# Patient Record
Sex: Male | Born: 1985 | Race: Black or African American | Hispanic: No | Marital: Single | Smoking: Never smoker
Health system: Southern US, Community
[De-identification: ages and names within clinical notes are randomized; demographics above are authoritative.]

## PROBLEM LIST (undated history)

## (undated) DIAGNOSIS — J45909 Unspecified asthma, uncomplicated: Secondary | ICD-10-CM

## (undated) HISTORY — PX: ORTHOPEDIC SURGERY: SHX850

## (undated) HISTORY — PX: WISDOM TOOTH EXTRACTION: SHX21

---

## 2008-10-28 ENCOUNTER — Emergency Department (HOSPITAL_COMMUNITY): Admission: EM | Admit: 2008-10-28 | Discharge: 2008-10-28 | Payer: Self-pay | Admitting: Family Medicine

## 2009-04-17 ENCOUNTER — Emergency Department (HOSPITAL_COMMUNITY): Admission: EM | Admit: 2009-04-17 | Discharge: 2009-04-17 | Payer: Self-pay | Admitting: Family Medicine

## 2010-03-04 ENCOUNTER — Emergency Department (HOSPITAL_COMMUNITY)
Admission: EM | Admit: 2010-03-04 | Discharge: 2010-03-04 | Payer: Self-pay | Source: Home / Self Care | Admitting: Emergency Medicine

## 2012-09-19 ENCOUNTER — Encounter (HOSPITAL_COMMUNITY): Payer: Self-pay | Admitting: *Deleted

## 2012-09-19 ENCOUNTER — Emergency Department (HOSPITAL_COMMUNITY)
Admission: EM | Admit: 2012-09-19 | Discharge: 2012-09-19 | Disposition: A | Discharge status: Home / Self Care | Payer: Worker's Compensation | Attending: Emergency Medicine | Admitting: Emergency Medicine

## 2012-09-19 ENCOUNTER — Emergency Department (HOSPITAL_COMMUNITY): Payer: Worker's Compensation

## 2012-09-19 DIAGNOSIS — S0990XA Unspecified injury of head, initial encounter: Secondary | ICD-10-CM | POA: Insufficient documentation

## 2012-09-19 DIAGNOSIS — W208XXA Other cause of strike by thrown, projected or falling object, initial encounter: Secondary | ICD-10-CM | POA: Insufficient documentation

## 2012-09-19 DIAGNOSIS — Y929 Unspecified place or not applicable: Secondary | ICD-10-CM | POA: Insufficient documentation

## 2012-09-19 DIAGNOSIS — Y9389 Activity, other specified: Secondary | ICD-10-CM | POA: Insufficient documentation

## 2012-09-19 DIAGNOSIS — Z79899 Other long term (current) drug therapy: Secondary | ICD-10-CM | POA: Insufficient documentation

## 2012-09-19 DIAGNOSIS — H538 Other visual disturbances: Secondary | ICD-10-CM | POA: Insufficient documentation

## 2012-09-19 DIAGNOSIS — F0781 Postconcussional syndrome: Secondary | ICD-10-CM | POA: Insufficient documentation

## 2012-09-19 MED ORDER — METOCLOPRAMIDE HCL 5 MG/ML IJ SOLN
10.0000 mg | Freq: Once | INTRAMUSCULAR | Status: AC
  Administered 2012-09-19: 10 mg via INTRAMUSCULAR
  Filled 2012-09-19: qty 2

## 2012-09-19 MED ORDER — ONDANSETRON HCL 4 MG PO TABS: 4.0000 mg | ORAL_TABLET | Freq: Four times a day (QID) | ORAL | Status: AC

## 2012-09-19 MED ORDER — DIPHENHYDRAMINE HCL 50 MG/ML IJ SOLN
50.0000 mg | Freq: Once | INTRAMUSCULAR | Status: AC
  Administered 2012-09-19: 50 mg via INTRAMUSCULAR
  Filled 2012-09-19: qty 1

## 2012-09-19 NOTE — Progress Notes (Signed)
Patient reports that he has a pcp in [place] but can't remember her name.  Also reports that he goes to the [place] but stated, "I can go to any one I want."  Patient stated that he has [employer], male visitor at bedside agreed with this.  [facility] printed patient a list of physicians who accept [employer] insurance within [place] of patient's zip code which is listed as [place].  Patient thankful for information.
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
## 2012-09-20 NOTE — ED Provider Notes (Signed)
Medical screening examination/treatment/procedure(s) were performed by non-physician practitioner and as supervising physician I was immediately available for consultation/collaboration.  Helmuth Recupero, MD 09/20/12 1758 

## 2013-07-16 ENCOUNTER — Encounter (HOSPITAL_COMMUNITY): Payer: Self-pay | Admitting: Emergency Medicine

## 2013-07-16 ENCOUNTER — Emergency Department (HOSPITAL_COMMUNITY)
Admission: EM | Admit: 2013-07-16 | Discharge: 2013-07-16 | Disposition: A | Discharge status: Home / Self Care | Payer: BC Managed Care – HMO | Source: Home / Self Care | Attending: Emergency Medicine | Admitting: Emergency Medicine

## 2013-07-16 DIAGNOSIS — K5289 Other specified noninfective gastroenteritis and colitis: Secondary | ICD-10-CM

## 2013-07-16 DIAGNOSIS — K529 Noninfective gastroenteritis and colitis, unspecified: Secondary | ICD-10-CM

## 2013-07-16 LAB — POCT I-STAT, CHEM 8
BUN: 4 mg/dL — ABNORMAL LOW (ref 6–23)
CREATININE: 1.3 mg/dL (ref 0.50–1.35)
Calcium, Ion: 1.23 mmol/L (ref 1.12–1.23)
Chloride: 97 mEq/L (ref 96–112)
Glucose, Bld: 95 mg/dL (ref 70–99)
HEMATOCRIT: 54 % — AB (ref 39.0–52.0)
HEMOGLOBIN: 18.4 g/dL — AB (ref 13.0–17.0)
POTASSIUM: 4.6 meq/L (ref 3.7–5.3)
SODIUM: 138 meq/L (ref 137–147)
TCO2: 27 mmol/L (ref 0–100)

## 2013-07-16 MED ORDER — ACETAMINOPHEN 325 MG PO TABS
ORAL_TABLET | ORAL | Status: AC
  Filled 2013-07-16: qty 2

## 2013-07-16 MED ORDER — ACETAMINOPHEN 325 MG PO TABS
650.0000 mg | ORAL_TABLET | Freq: Once | ORAL | Status: AC
  Administered 2013-07-16: 650 mg via ORAL

## 2013-07-16 MED ORDER — ONDANSETRON 4 MG PO TBDP
8.0000 mg | ORAL_TABLET | Freq: Once | ORAL | Status: AC
  Administered 2013-07-16: 8 mg via ORAL

## 2013-07-16 MED ORDER — ONDANSETRON 8 MG PO TBDP: 8.0000 mg | ORAL_TABLET | Freq: Three times a day (TID) | ORAL | Status: AC | PRN

## 2013-07-16 MED ORDER — SODIUM CHLORIDE 0.9 % IV SOLN
INTRAVENOUS | Status: DC
  Administered 2013-07-16: 12:00:00 via INTRAVENOUS

## 2013-07-16 MED ORDER — SODIUM CHLORIDE 0.9 % IV SOLN
INTRAVENOUS | Status: DC
  Administered 2013-07-16: 11:00:00 via INTRAVENOUS

## 2013-07-16 MED ORDER — ONDANSETRON 4 MG PO TBDP
ORAL_TABLET | ORAL | Status: AC
  Filled 2013-07-16: qty 2

## 2013-07-16 MED ORDER — DIPHENOXYLATE-ATROPINE 2.5-0.025 MG PO TABS: 1.0000 | ORAL_TABLET | Freq: Four times a day (QID) | ORAL | Status: AC | PRN

## 2013-07-16 NOTE — ED Provider Notes (Signed)
Chief Complaint   Chief Complaint  Patient presents with  . Diarrhea     History of Present Illness   Keiston Logan Boresvans is a 28 year old male who has had a three-day history of diarrhea and nausea after eating at [employer]. The patient has up to 15 loose stools per day. He states in the stools have been red in color. He has lost about 6 pounds. He feels cold and chilled but has had no fever. He felt nauseated but not having vomiting. He notes borborygmus but no bowel pain or cramping. He's had no URI symptoms. No other suspicious ingestions, sick exposures, or foreign travel. He has never had anything like this before.  Review of Systems   Other than as noted above, the patient denies any of the following symptoms: Systemic:  No fevers, chills, or dizziness. ENT:  No nasal congestion, rhinorrhea, or sore throat. Lungs:  No cough. GI:  Blood in stool or vomitus. GU:  No dysuria, frequency, or urgency.  PMFSH   Past medical history, family history, social history, meds, and allergies were reviewed.    Physical Exam     Vital signs:  BP 122/81  Pulse 98  Temp(Src) 99.8 F (37.7 C) (Oral)  Resp 14  SpO2 100% Filed Vitals:   07/16/13 1026 07/16/13 1112 Supine  07/16/13 1112 Sitting  07/16/13 1113 Standing   BP: 122/86 125/74 120/79 122/81  Pulse: 98 79 93 98  Temp: 99.8 F (37.7 C)     TempSrc: Oral     Resp: 14     SpO2: 100%      General:  Alert and oriented.  In no distress.  Skin warm and dry.  Good skin turgor, brisk capillary refill. ENT:  No scleral icterus, moist mucous membranes, no oral lesions, pharynx clear. Lungs:  Breath sounds clear and equal bilaterally.  No wheezes, rales, or rhonchi. Heart:  Rhythm regular, without extrasystoles.  No gallops or murmers. Abdomen:  Soft, nontender, no organomegaly or mass, bowel sounds hyperactive. Skin: Clear, warm, and dry.  Good turgor.  Brisk capillary refill.  Labs   Results for orders placed during the hospital  encounter of 07/16/13  POCT I-STAT, CHEM 8      Result Value Ref Range   Sodium 138  137 - 147 mEq/L   Potassium 4.6  3.7 - 5.3 mEq/L   Chloride 97  96 - 112 mEq/L   BUN 4 (*) 6 - 23 mg/dL   Creatinine, Ser 8.291.30  0.50 - 1.35 mg/dL   Glucose, Bld 95  70 - 99 mg/dL   Calcium, Ion 5.621.23  1.301.12 - 1.23 mmol/L   TCO2 27  0 - 100 mmol/L   Hemoglobin 18.4 (*) 13.0 - 17.0 g/dL   HCT 86.554.0 (*) 78.439.0 - 69.652.0 %     Course in Urgent [facility] Center   Given Zofran ODT 8 mg sublingually and 2 L of normal saline intravenously.Thereafter, he felt a lot better.   Assessment   The encounter diagnosis was Gastroenteritis.  Plan   1.  Meds:  The following meds were prescribed:   New Prescriptions   DIPHENOXYLATE-ATROPINE (LOMOTIL) 2.5-0.025 MG PER TABLET    Take 1 tablet by mouth 4 (four) times daily as needed for diarrhea or loose stools.   ONDANSETRON (ZOFRAN ODT) 8 MG DISINTEGRATING TABLET    Take 1 tablet (8 mg total) by mouth every 8 (eight) hours as needed for nausea.    2.  Patient Education/Counseling:  The patient  was given appropriate handouts, self care instructions, and instructed in symptomatic relief. The patient was told to stay on clear liquids for the remainder of the day, then advance to a B.R.A.T. diet starting tomorrow.   3.  Follow up:  The patient was told to follow up here if no better in 2 to 3 days, or sooner if becoming worse in any way, and given some red flag symptoms such as persistent vomitng, high fever, severe abdominal pain, or any GI bleeding which would prompt immediate return.         Reuben Likesavid C Justus Duerr, MD 07/16/13 [phone]

## 2013-07-16 NOTE — Discharge Instructions (Signed)

## 2013-07-16 NOTE — ED Notes (Signed)
Reports having diarrhea since Saturday.  Unable to eat or drink with having diarrhea.  Has not eaten since Saturday.  6 lb weight loss.  Fatigue.  Headache.  No otc meds taken.
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
## 2020-04-14 ENCOUNTER — Emergency Department (HOSPITAL_BASED_OUTPATIENT_CLINIC_OR_DEPARTMENT_OTHER)

## 2020-04-14 ENCOUNTER — Other Ambulatory Visit: Payer: Self-pay

## 2020-04-14 ENCOUNTER — Emergency Department (HOSPITAL_BASED_OUTPATIENT_CLINIC_OR_DEPARTMENT_OTHER)
Admission: EM | Admit: 2020-04-14 | Discharge: 2020-04-14 | Disposition: A | Discharge status: Home / Self Care | Attending: Emergency Medicine | Admitting: Emergency Medicine

## 2020-04-14 ENCOUNTER — Encounter (HOSPITAL_BASED_OUTPATIENT_CLINIC_OR_DEPARTMENT_OTHER): Payer: Self-pay | Admitting: Emergency Medicine

## 2020-04-14 ENCOUNTER — Other Ambulatory Visit (HOSPITAL_BASED_OUTPATIENT_CLINIC_OR_DEPARTMENT_OTHER): Payer: Self-pay | Admitting: Emergency Medicine

## 2020-04-14 DIAGNOSIS — N50812 Left testicular pain: Secondary | ICD-10-CM

## 2020-04-14 DIAGNOSIS — N5089 Other specified disorders of the male genital organs: Secondary | ICD-10-CM | POA: Insufficient documentation

## 2020-04-14 DIAGNOSIS — I861 Scrotal varices: Secondary | ICD-10-CM | POA: Insufficient documentation

## 2020-04-14 DIAGNOSIS — J45909 Unspecified asthma, uncomplicated: Secondary | ICD-10-CM | POA: Diagnosis not present

## 2020-04-14 HISTORY — DX: Unspecified asthma, uncomplicated: J45.909

## 2020-04-14 LAB — URINALYSIS, ROUTINE W REFLEX MICROSCOPIC
Bilirubin Urine: NEGATIVE
Glucose, UA: NEGATIVE mg/dL
Hgb urine dipstick: NEGATIVE
Ketones, ur: NEGATIVE mg/dL
Leukocytes,Ua: NEGATIVE
Nitrite: NEGATIVE
Protein, ur: NEGATIVE mg/dL
Specific Gravity, Urine: 1.015 (ref 1.005–1.030)
pH: 7 (ref 5.0–8.0)

## 2020-04-14 MED ORDER — DOXYCYCLINE HYCLATE 100 MG PO CAPS: 100.0000 mg | ORAL_CAPSULE | Freq: Two times a day (BID) | ORAL | 0 refills | Status: DC

## 2020-04-14 MED ORDER — CEFTRIAXONE SODIUM 500 MG IJ SOLR
500.0000 mg | Freq: Once | INTRAMUSCULAR | Status: AC
Start: 2020-04-14 — End: 2020-04-14
  Administered 2020-04-14: 500 mg via INTRAMUSCULAR
  Filled 2020-04-14: qty 500

## 2020-04-14 MED ORDER — LIDOCAINE HCL (PF) 1 % IJ SOLN
INTRAMUSCULAR | Status: AC
  Administered 2020-04-14: 5 mL
  Filled 2020-04-14: qty 5

## 2020-04-14 MED FILL — DOXYCYCLINE HYCLATE 100 MG: 100 | 10 days supply | Qty: 20 | Fill #0

## 2020-04-14 NOTE — ED Notes (Signed)
Patient transported to [facility] 

## 2020-04-14 NOTE — Discharge Instructions (Addendum)
You were seen in the emergency department for left-sided testicular pain.  Your urinalysis did not show any obvious signs of infection.  Your ultrasound also did not show an obvious explanation for your symptoms.  They did show signs of a varicocele and some microlithiasis.  This should be followed up with urology.  Please finish your antibiotics.  You can use Tylenol and ibuprofen as needed for pain.  Return if any worsening or concerning symptoms.  Included below is the report of the ultrasound.  IMPRESSION:  1. No acute ultrasound abnormality of the bilateral testicles or  epididymis. Symmetric arterial and venous Doppler flow is present  bilaterally.  2. Small left-sided varicocele.  3. Testicular microlithiasis. Current literature suggests that  testicular microlithiasis is not a significant independent risk  factor for development of testicular carcinoma, and that follow up  imaging is not warranted in the absence of other risk factors.  Monthly testicular self-examination and annual physical exams are  considered appropriate surveillance. If patient has other risk  factors for testicular carcinoma, then referral to Urology should be  considered. (Reference: DeCastro, et al.: A 5-Year Follow up Study  of Asymptomatic Men with Testicular Microlithiasis. J Urol 2008;  179:1420-1423.)

## 2020-04-14 NOTE — ED Provider Notes (Signed)
[facility] EMERGENCY DEPARTMENT Provider Note   CSN: [id number] Arrival date & time: 04/14/20  9030     History Chief Complaint  Patient presents with  . Testicle Pain    Eric Hoover is a 35 y.o. male.  He is complaining of left testicular pain that is been going on over a week.  He attributes this to a long plane flight on [employer] transport which vibrated a lot.  Since then he has noticed some pain in his scrotum but now it is more isolated to a bump that he identified on the left testicle.  No urinary symptoms.  He is sexually active with a single partner and states he usually uses barrier protection.  No discharge or lesions.  No fevers or chills.  The history is provided by the patient.  Testicle Pain This is a new problem. The current episode started more than 1 week ago. The problem occurs constantly. The problem has been gradually improving. Pertinent negatives include no chest pain, no abdominal pain, no headaches and no shortness of breath. Exacerbated by: palpation. Nothing relieves the symptoms. He has tried nothing for the symptoms. The treatment provided mild relief.       Past Medical History:  Diagnosis Date  . Asthma     There are no problems to display for this patient.   Past Surgical History:  Procedure Laterality Date  . ORTHOPEDIC SURGERY    . WISDOM TOOTH EXTRACTION         No family history on file.  Social History   Tobacco Use  . Smoking status: Never Smoker  . Smokeless tobacco: Never Used  Substance Use Topics  . Alcohol use: Yes  . Drug use: No    Home Medications Prior to Admission medications   Medication Sig Start Date End Date Taking? Authorizing Provider  albuterol (PROVENTIL HFA;VENTOLIN HFA) 108 (90 BASE) MCG/ACT inhaler Inhale 2 puffs into the lungs every 6 (six) hours as needed for wheezing.   Yes [provider]  diphenoxylate-atropine (LOMOTIL) 2.5-0.025 MG per tablet Take 1 tablet by mouth 4 (four)  times daily as needed for diarrhea or loose stools. 07/16/13  Yes Reuben Likes, MD  ondansetron (ZOFRAN ODT) 8 MG disintegrating tablet Take 1 tablet (8 mg total) by mouth every 8 (eight) hours as needed for nausea. 07/16/13   Reuben Likes, MD  ondansetron (ZOFRAN) 4 MG tablet Take 1 tablet (4 mg total) by mouth every 6 (six) hours. 09/19/12   Sciacca, Ashok Cordia, PA-C    Allergies    Patient has no known allergies.  Review of Systems   Review of Systems  Constitutional: Negative for fever.  Respiratory: Negative for shortness of breath.   Cardiovascular: Negative for chest pain.  Gastrointestinal: Negative for abdominal pain.  Genitourinary: Positive for testicular pain. Negative for dysuria, genital sores, hematuria, penile discharge and penile pain.  Skin: Negative for rash.  Neurological: Negative for headaches.    Physical Exam Updated Vital Signs BP 123/78 (BP Location: Right Arm)   Pulse 83   Temp 98.3 F (36.8 C)   Resp 16   Ht 6\' 1"  (1.854 m)   Wt 99.3 kg   SpO2 100%   BMI 28.89 kg/m   Physical Exam Vitals and nursing note reviewed.  Constitutional:      Appearance: He is well-developed and well-nourished.  HENT:     Head: Normocephalic and atraumatic.  Eyes:     Conjunctiva/sclera: Conjunctivae normal.  Pulmonary:  Effort: Pulmonary effort is normal.  Abdominal:     Hernia: There is no hernia in the left inguinal area or right inguinal area.  Genitourinary:    Penis: Normal.      Testes:        Right: Tenderness not present.        Left: Tenderness present. Mass not present.     Epididymis:     Right: No tenderness.     Left: Tenderness present.  Musculoskeletal:     Cervical back: Neck supple.  Skin:    General: Skin is warm and dry.  Neurological:     Mental Status: He is alert.     GCS: GCS eye subscore is 4. GCS verbal subscore is 5. GCS motor subscore is 6.  Psychiatric:        Mood and Affect: Mood and affect normal.     ED Results /  Procedures / Treatments   Labs (all labs ordered are listed, but only abnormal results are displayed) Labs Reviewed  URINALYSIS, ROUTINE W REFLEX MICROSCOPIC  GC/CHLAMYDIA PROBE AMP ([place]) NOT AT [facility]    EKG None  Radiology US SCROTUM W/DOPPLER  Result Date: 04/14/2020 CLINICAL DATA:  Left testicular pain and mass EXAM: SCROTAL ULTRASOUND DOPPLER ULTRASOUND OF THE TESTICLES TECHNIQUE: Complete ultrasound examination of the testicles, epididymis, and other scrotal structures was performed. Color and spectral Doppler ultrasound were also utilized to evaluate blood flow to the testicles. COMPARISON:  None. FINDINGS: Right testicle Measurements: 5.0 x 2.5 x 3.5 cm.  No mass.  Microlithiasis. Left testicle Measurements:  5.7 x 2.4 x 3.5 cm.  No mass.  Microlithiasis. Right epididymis:  Normal in size and appearance. Left epididymis:  Normal in size and appearance. Hydrocele:  None visualized. Varicocele:  Small left-sided varicocele. Pulsed Doppler interrogation of both testes demonstrates normal low resistance arterial and venous waveforms bilaterally. IMPRESSION: 1. No acute ultrasound abnormality of the bilateral testicles or epididymis. Symmetric arterial and venous Doppler flow is present bilaterally. 2. Small left-sided varicocele. 3. Testicular microlithiasis. Current literature suggests that testicular microlithiasis is not a significant independent risk factor for development of testicular carcinoma, and that follow up imaging is not warranted in the absence of other risk factors. Monthly testicular self-examination and annual physical exams are considered appropriate surveillance. If patient has other risk factors for testicular carcinoma, then referral to Urology should be considered. (Reference: DeCastro, et al.: A 5-Year Follow up Study of Asymptomatic Men with Testicular Microlithiasis. J Urol 2008; 179:1420-1423.) Electronically Signed   By: Lauralyn Primes M.D.   On: 04/14/2020 08:53     Procedures Procedures   Medications Ordered in ED Medications  cefTRIAXone (ROCEPHIN) injection 500 mg (500 mg Intramuscular Given 04/14/20 0920)  lidocaine (PF) (XYLOCAINE) 1 % injection (5 mLs  Given 04/14/20 0920)    ED Course  I have reviewed the triage vital signs and the nursing notes.  Pertinent labs & imaging results that were available during my care of the patient were reviewed by me and considered in my medical decision making (see chart for details).  Clinical Course as of 04/14/20 1757  Mon Apr 14, 2020  5643 Reviewed results with patient.  He is comfortable with treatment for possible STD with ceftriaxone and doxy.  Due to the microlithiasis we will give him urology follow-up if needed. [MB]    Clinical Course User Index [MB] Terrilee Files, MD   MDM Rules/Calculators/A&P  This patient complains of left-sided testicular pain; this involves an extensive number of treatment Options and is a complaint that carries with it a high risk of complications and Morbidity. The differential includes torsion, epididymitis, hydrocele, varicocele, testicular trauma, STD, UTI  I ordered, reviewed and interpreted labs, which included urinalysis without clear signs of infection, GC chlamydia pending at time of discharge I ordered medication IM ceftriaxone I ordered imaging studies which included scrotal ultrasound and I independently    visualized and interpreted imaging which showed no acute findings, dental varicocele and microlithiasis Previous records obtained and reviewed in epic, no recent admissions  After the interventions stated above, I reevaluated the patient and found patient to be minimally symptomatic.  Reviewed findings with him.  He is comfortable plan for outpatient follow-up.  Return instructions discussed   Final Clinical Impression(s) / ED Diagnoses Final diagnoses:  Testicular pain, left  Varicocele  Testicular microlithiasis     Rx / DC Orders ED Discharge Orders         Ordered    doxycycline (VIBRAMYCIN) 100 MG capsule  2 times daily        04/14/20 0913           Terrilee Files, MD 04/14/20 1800

## 2020-04-14 NOTE — ED Triage Notes (Signed)
painful left testicle since 22 , after flight to [place]  ,  Has a bump on it less painful  Now  But still hurt, denies dysuria or blood .

## 2020-04-15 LAB — GC/CHLAMYDIA PROBE AMP (~~LOC~~) NOT AT ARMC
Chlamydia: NEGATIVE
Comment: NEGATIVE
Comment: NORMAL
Neisseria Gonorrhea: NEGATIVE

## 2020-05-26 ENCOUNTER — Encounter (HOSPITAL_BASED_OUTPATIENT_CLINIC_OR_DEPARTMENT_OTHER): Payer: Self-pay

## 2020-05-26 ENCOUNTER — Other Ambulatory Visit (HOSPITAL_BASED_OUTPATIENT_CLINIC_OR_DEPARTMENT_OTHER): Payer: Self-pay | Admitting: Emergency Medicine

## 2020-05-26 ENCOUNTER — Other Ambulatory Visit: Payer: Self-pay

## 2020-05-26 ENCOUNTER — Emergency Department (HOSPITAL_BASED_OUTPATIENT_CLINIC_OR_DEPARTMENT_OTHER)
Admission: EM | Admit: 2020-05-26 | Discharge: 2020-05-26 | Disposition: A | Discharge status: Home / Self Care | Attending: Emergency Medicine | Admitting: Emergency Medicine

## 2020-05-26 DIAGNOSIS — R590 Localized enlarged lymph nodes: Secondary | ICD-10-CM | POA: Insufficient documentation

## 2020-05-26 DIAGNOSIS — S39012A Strain of muscle, fascia and tendon of lower back, initial encounter: Secondary | ICD-10-CM | POA: Insufficient documentation

## 2020-05-26 DIAGNOSIS — X58XXXA Exposure to other specified factors, initial encounter: Secondary | ICD-10-CM | POA: Diagnosis not present

## 2020-05-26 DIAGNOSIS — J45909 Unspecified asthma, uncomplicated: Secondary | ICD-10-CM | POA: Diagnosis not present

## 2020-05-26 DIAGNOSIS — Z91010 Allergy to peanuts: Secondary | ICD-10-CM | POA: Diagnosis not present

## 2020-05-26 DIAGNOSIS — S34109A Unspecified injury to unspecified level of lumbar spinal cord, initial encounter: Secondary | ICD-10-CM | POA: Diagnosis present

## 2020-05-26 MED ORDER — METHOCARBAMOL 500 MG PO TABS: 500.0000 mg | ORAL_TABLET | Freq: Two times a day (BID) | ORAL | 0 refills | Status: DC

## 2020-05-26 MED ORDER — IBUPROFEN 600 MG PO TABS: 600.0000 mg | ORAL_TABLET | Freq: Four times a day (QID) | ORAL | 0 refills | Status: DC | PRN

## 2020-05-26 NOTE — ED Triage Notes (Signed)
Pt c/o swelling to lymph nodes anterior neck-denies fever/cough/URI sx-also c/o lower back pain x 1 week-NAD-steady gait

## 2020-05-26 NOTE — Discharge Instructions (Signed)
Please follow-up with your primary care doctor.  Please drink plenty of water, use warm compresses to the area of your neck that is swollen.  I also recommend warm compresses to your back, gentle stretches, Tylenol and ibuprofen as discussed below as well as the muscle relaxer Robaxin that I prescribed you.  Please use Tylenol or ibuprofen for pain.  You may use 600 mg ibuprofen every 6 hours or 1000 mg of Tylenol every 6 hours.  You may choose to alternate between the 2.  This would be most effective.  Not to exceed 4 g of Tylenol within 24 hours.  Not to exceed 3200 mg ibuprofen 24 hours.

## 2020-05-26 NOTE — ED Provider Notes (Signed)
[facility] EMERGENCY DEPARTMENT Provider Note   CSN: [id number] Arrival date & time: 05/26/20  1135     History Chief Complaint  Patient presents with  . Lymphadenopathy    Eric Hoover is a 35 y.o. male.  HPI Patient is presented to the ER today with 1 week of low back pain.  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  He states that he also noticed yesterday that he had some swollen lymph nodes in his neck.  He denies any other symptoms states that he came to the ER since both of these issues were concerning to him and he would like to be checked out.  He denies any chest pain, shortness breath, cough, nausea, vomiting, fevers, chills, lightheadedness, dizziness.  No exposure to HIV that he knows of and he denies any sore throat, weight changes, night sweats, dizziness or other associated symptoms.  He states that his back pain is 6/10 he states that it is relatively well controlled with Tylenol and ibuprofen.  Denies any saddle anesthesia, history of IV drug use, any other new or concerning symptoms or any significant sudden changes or trauma.      Past Medical History:  Diagnosis Date  . Asthma     There are no problems to display for this patient.   Past Surgical History:  Procedure Laterality Date  . ORTHOPEDIC SURGERY    . WISDOM TOOTH EXTRACTION         No family history on file.  Social History   Tobacco Use  . Smoking status: Never Smoker  . Smokeless tobacco: Never Used  Substance Use Topics  . Alcohol use: Yes    Comment: occ  . Drug use: No    Home Medications Prior to Admission medications   Medication Sig Start Date End Date Taking? Authorizing Provider  ibuprofen (ADVIL) 600 MG tablet Take 1 tablet (600 mg total) by mouth every 6 (six) hours as needed. 05/26/20  Yes Tanette Chauca S, PA  albuterol (PROVENTIL HFA;VENTOLIN HFA) 108 (90 BASE) MCG/ACT inhaler Inhale 2 puffs into the lungs every 6 (six) hours as  needed for wheezing.    [provider]  diphenoxylate-atropine (LOMOTIL) 2.5-0.025 MG per tablet Take 1 tablet by mouth 4 (four) times daily as needed for diarrhea or loose stools. 07/16/13   Reuben Likes, MD  doxycycline (VIBRAMYCIN) 100 MG capsule Take 1 capsule (100 mg total) by mouth 2 (two) times daily. 04/14/20   Terrilee Files, MD  methocarbamol (ROBAXIN) 500 MG tablet Take 1 tablet (500 mg total) by mouth 2 (two) times daily. 05/26/20   Waldine Zenz, Rodrigo Ran, PA  ondansetron (ZOFRAN ODT) 8 MG disintegrating tablet Take 1 tablet (8 mg total) by mouth every 8 (eight) hours as needed for nausea. 07/16/13   Reuben Likes, MD  ondansetron (ZOFRAN) 4 MG tablet Take 1 tablet (4 mg total) by mouth every 6 (six) hours. 09/19/12   Sciacca, Marissa, PA-C    Allergies    Peanut-containing drug products and Soy allergy  Review of Systems   Review of Systems  Constitutional: Negative for chills and fever.  HENT: Negative for congestion.        Lymphadenopathy  Eyes: Negative for pain.  Respiratory: Negative for cough and shortness of breath.   Cardiovascular: Negative for chest pain and leg swelling.  Gastrointestinal: Negative for abdominal pain and vomiting.  Genitourinary: Negative for dysuria.  Musculoskeletal: Positive for back pain.  Negative for myalgias.  Skin: Negative for rash.  Neurological: Negative for dizziness and headaches.    Physical Exam Updated Vital Signs BP (!) 131/96 (BP Location: Left Arm)   Pulse 71   Temp 98.7 F (37.1 C) (Oral)   Resp 18   Ht 6\' 1"  (1.854 m)   Wt 102.5 kg   SpO2 100%   BMI 29.82 kg/m   Physical Exam Vitals and nursing note reviewed.  Constitutional:      General: He is not in acute distress.    Appearance: Normal appearance. He is not ill-appearing.     Comments: Pleasant 35 year old male in no acute stress  HENT:     Head: Normocephalic and atraumatic.     Mouth/Throat:     Comments: Clear oral mucosa and posterior pharynx  without erythema or injection no tonsillar exudates or hypertrophy Eyes:     General: No scleral icterus.       Right eye: No discharge.        Left eye: No discharge.     Conjunctiva/sclera: Conjunctivae normal.  Neck:     Comments: Anterior cervical chain lymphadenopathy present it is mild with mild associated tenderness. Pulmonary:     Effort: Pulmonary effort is normal.     Breath sounds: No stridor.  Musculoskeletal:     Comments: There is tenderness palpation of the paravertebral lumbar muscles.  There are trigger points.  Full range motion intact  Neurological:     Mental Status: He is alert and oriented to person, place, and time. Mental status is at baseline.     ED Results / Procedures / Treatments   Labs (all labs ordered are listed, but only abnormal results are displayed) Labs Reviewed - No data to display  EKG None  Radiology No results found.  Procedures Procedures   Medications Ordered in ED Medications - No data to display  ED Course  I have reviewed the triage vital signs and the nursing notes.  Pertinent labs & imaging results that were available during my care of the patient were reviewed by me and considered in my medical decision making (see chart for details).    MDM Rules/Calculators/A&P                          Patient is healthy 35 year old male with no pertinent past medical history presented today with anterior cervical lymphadenopathy began yesterday evening.  He has no associated symptoms with this.  A separate symptom that brought him to the ER today is ongoing low back pain.  He has no red flag symptoms.  Will send home with Robaxin, ibuprofen and Tylenol recommendations.  All questions answered best my ability.  I did stress the importance of close follow-up with primary care doctor.  The symptoms with anterior cervical lymphadenopathy do not improve over the next week or 2 he may need further evaluation.   Final Clinical  Impression(s) / ED Diagnoses Final diagnoses:  Strain of lumbar region, initial encounter  Anterior cervical lymphadenopathy    Rx / DC Orders ED Discharge Orders         Ordered    methocarbamol (ROBAXIN) 500 MG tablet  2 times daily,   Status:  Discontinued        05/26/20 1425    ibuprofen (ADVIL) 600 MG tablet  Every 6 hours PRN        05/26/20 1440    methocarbamol (ROBAXIN) 500 MG tablet  2 times daily        05/26/20 1440           Solon Augusta [place], [place] 05/26/20 1603    Alvira Monday, MD 05/27/20 [id number]

## 2020-05-26 NOTE — ED Notes (Signed)
Pt discharged by Doreatha Martin, RN Psychologist, occupational)

## 2021-01-18 ENCOUNTER — Other Ambulatory Visit: Payer: Self-pay

## 2021-01-18 ENCOUNTER — Emergency Department (HOSPITAL_BASED_OUTPATIENT_CLINIC_OR_DEPARTMENT_OTHER)
Admission: EM | Admit: 2021-01-18 | Discharge: 2021-01-19 | Disposition: A | Discharge status: Home / Self Care | Attending: Emergency Medicine | Admitting: Emergency Medicine

## 2021-01-18 ENCOUNTER — Encounter (HOSPITAL_BASED_OUTPATIENT_CLINIC_OR_DEPARTMENT_OTHER): Payer: Self-pay | Admitting: Urology

## 2021-01-18 DIAGNOSIS — J45909 Unspecified asthma, uncomplicated: Secondary | ICD-10-CM | POA: Diagnosis not present

## 2021-01-18 DIAGNOSIS — Z91010 Allergy to peanuts: Secondary | ICD-10-CM | POA: Diagnosis not present

## 2021-01-18 DIAGNOSIS — R202 Paresthesia of skin: Secondary | ICD-10-CM | POA: Insufficient documentation

## 2021-01-18 DIAGNOSIS — R519 Headache, unspecified: Secondary | ICD-10-CM | POA: Insufficient documentation

## 2021-01-18 DIAGNOSIS — R200 Anesthesia of skin: Secondary | ICD-10-CM | POA: Diagnosis present

## 2021-01-18 NOTE — ED Triage Notes (Signed)
Pt states right sided jaw numbness x 5 hours, denies any other symptoms.  Face symmetrical, speech clear.  No tongue deviation

## 2021-01-19 NOTE — ED Notes (Signed)
EDP Rees at bedside.  

## 2021-01-19 NOTE — ED Provider Notes (Signed)
[facility] EMERGENCY DEPARTMENT Provider Note   CSN: [id number] Arrival date & time: 01/18/21  2116     History Chief Complaint  Patient presents with   facial numbness    Eric Hoover is a 35 y.o. male.  The history is provided by the patient.  Eric Hoover is a 35 y.o. male who presents to the Emergency Department complaining of numbness.  He presents to the ED for evaluation of numbness to bilateral cheeks that occurred at 3pm.  Described as a feeling like when your feet go to sleep.  Sxs made him very anxious.  Had a mild headache earlier and took a BC powder earlier today (around 5) - HA now gone.    No fever, chest pain, abdominal pain.  No weakness, no dysuria.    Started using cpap three days ago.  Feels dry in his nose and mouth.      Past Medical History:  Diagnosis Date   Asthma     There are no problems to display for this patient.   Past Surgical History:  Procedure Laterality Date   ORTHOPEDIC SURGERY     WISDOM TOOTH EXTRACTION         History reviewed. No pertinent family history.  Social History   Tobacco Use   Smoking status: Never   Smokeless tobacco: Never  Substance Use Topics   Alcohol use: Yes    Comment: occ   Drug use: No    Home Medications Prior to Admission medications   Medication Sig Start Date End Date Taking? Authorizing Provider  albuterol (PROVENTIL HFA;VENTOLIN HFA) 108 (90 BASE) MCG/ACT inhaler Inhale 2 puffs into the lungs every 6 (six) hours as needed for wheezing.    [provider]  diphenoxylate-atropine (LOMOTIL) 2.5-0.025 MG per tablet Take 1 tablet by mouth 4 (four) times daily as needed for diarrhea or loose stools. 07/16/13   Reuben Likes, MD  doxycycline (VIBRAMYCIN) 100 MG capsule TAKE 1 CAPSULE BY MOUTH TWICE DAILY 04/14/20 04/14/21  Terrilee Files, MD  ibuprofen (ADVIL) 600 MG tablet TAKE 1 TABLET (600 MG TOTAL) BY MOUTH EVERY 6 (SIX) HOURS AS NEEDED. 05/26/20 05/26/21  Gailen Shelter, PA  methocarbamol (ROBAXIN) 500 MG tablet TAKE 1 TABLET (500 MG TOTAL) BY MOUTH 2 (TWO) TIMES DAILY. 05/26/20 05/26/21  Gailen Shelter, PA  ondansetron (ZOFRAN ODT) 8 MG disintegrating tablet Take 1 tablet (8 mg total) by mouth every 8 (eight) hours as needed for nausea. 07/16/13   Reuben Likes, MD  ondansetron (ZOFRAN) 4 MG tablet Take 1 tablet (4 mg total) by mouth every 6 (six) hours. 09/19/12   Sciacca, Marissa, PA-C    Allergies    Peanut-containing drug products and Soy allergy  Review of Systems   Review of Systems  All other systems reviewed and are negative.  Physical Exam Updated Vital Signs BP 122/84   Pulse 60   Temp 97.9 F (36.6 C) (Oral)   Resp 20   Ht 6\' 1"  (1.854 m)   Wt 97.5 kg   SpO2 99%   BMI 28.37 kg/m   Physical Exam Vitals and nursing note reviewed.  Constitutional:      Appearance: He is well-developed.  HENT:     Head: Normocephalic and atraumatic.  Cardiovascular:     Rate and Rhythm: Normal rate and regular rhythm.     Heart sounds: No murmur heard. Pulmonary:     Effort: Pulmonary effort is normal. No respiratory distress.  Breath sounds: Normal breath sounds.  Abdominal:     Palpations: Abdomen is soft.     Tenderness: There is no abdominal tenderness. There is no guarding or rebound.  Musculoskeletal:        General: No tenderness.     Cervical back: Neck supple.  Lymphadenopathy:     Cervical: No cervical adenopathy.  Skin:    General: Skin is warm and dry.  Neurological:     Mental Status: He is alert and oriented to person, place, and time.     Comments: Pupils equal round and reactive, EOM I. No asymmetry of facial movements. Sensation to light touch intact throughout the face and all four extremities. Five out of five strength in all four extremities.  Psychiatric:        Behavior: Behavior normal.    ED Results / Procedures / Treatments   Labs (all labs ordered are listed, but only abnormal results are displayed) Labs  Reviewed - No data to display  EKG None  Radiology No results found.  Procedures Procedures   Medications Ordered in ED Medications - No data to display  ED Course  I have reviewed the triage vital signs and the nursing notes.  Pertinent labs & imaging results that were available during my care of the patient were reviewed by me and considered in my medical decision making (see chart for details).    MDM Rules/Calculators/A&P                          patient here for evaluation of paresthesias to bilateral cheeks. At times they moved from one side to the other. He did recently initiate CPAP therapy at home. On evaluation he is neurologically intact. Presentation is not consistent with CVA, space occupying lesion, acute infectious process. Discussed with patient that his paresthesias are likely secondary to recent initiation of CPAP. Feel anxiety is a less likely cause of his symptoms but may worsen his symptoms. Recommend continuing his CPAP with outpatient follow-up and return precautions.  Final Clinical Impression(s) / ED Diagnoses Final diagnoses:  Paresthesia    Rx / DC Orders ED Discharge Orders     None        Tilden Fossa, MD 01/19/21 [phone]

## 2021-01-19 NOTE — ED Notes (Signed)
Pt states his numbness has now moved to the left side of his face and left arm. No other neuro defict. A&Ox4, no drift/weakness, face symmetrical, and speech clear.

## 2021-11-18 NOTE — Progress Notes (Signed)
NEW Eric Hoover Date of Service/Encounter:  11/19/21 Referring provider: Pecolia Ades, MD Primary care provider: Patient, No Pcp Per  Subjective:  Eric Hoover is a 36 y.o. male with a PMHx of GERD, eosinophilic asthma, pollen food allergy syndrome, IBS, migraines presenting today for evaluation of "allergies". History obtained from: chart review and Eric Hoover.  Chronic rhinitis: started in childhood Symptoms include: nasal congestion, rhinorrhea, post nasal drainage, sneezing, watery eyes, itchy eyes, and itchy nose  Occurs year-round with seasonal flares Potential triggers: Everything outdoors Treatments tried: Zyrtec, Singulair, Flonase, Patanol Previous allergy testing: yes History of reflux/heartburn: yes-takes pantoprazole No prior ENT surgeries. Previous Eric Hoover of our clinic many years ago in [place]. Had allergy testing as a child, but never on allergy immunotherapy  Asthma:  Adult onset-2009, prior to going to [place] when living in [place] Hospitalization-5 or 6 times, no ICU stays; last several years ago; not on controller inhaler when hospitalized Using rescue inhaler 1-2 times per week Taking advair 115 2 inhalations BID Triggers: activity No steroids in the past year  Food allergies:  When drinking soy milk, tongue itches.  Since this occurred he has avoided all products with soy in them. Raw peanut caused tongue to itch, but he is able to eat and tolerate peanut butter without any symptoms at all He eats tree nuts and tolerates. Occasionally he will have itchy tongue with certain fruits and vegetables, but this occurs intermittently. Does not carry an Epipen.   [facility] review[facility] with Flonase, Advair HFA 115, 2 sprays twice a day, Singulair 10 mg daily, Zyrtec 10 mg daily, Patanol eyedrops, pantoprazole 40 mg twice a day IgE environmental allergy panel positive to dust mites, [place] grass, Timothy grass,  Johnson grass, cockroach, maple/Box Elder, birch, mountain cedar oak, [place], Cottonwood, hickory/pecan tree, white mulberry tree, short ragweed, rough pigweed, sheep Sorrell Total IgE 114 SINUS CT: NEG A few representative images through the paranasal sinuses appear  clear. The mastoid air cells are suboptimally evaluated on this  exam. No obvious acute bony or soft tissue abnormality in the  field-of-view on this limited exam.  PRE-BRONCHODILATOR SPIROMETRY No spirometry evidence of obstruction BRONCHODILATOR RESPONSE AND POST-BRONCHODILATOR SPIROMETRY Clinically significant effect of bronchodilators (>= 12% increase and 200 ml increase in FEV1) LUNG VOLUMES No restriction present (TLC => 80% predicted) Other abnormalities Air trapping (RV/TLC elevated) DLCO No gas transfer defect (DLCO or DLCOAdj >80% predicted) Low FENO (<25 ppb).   Past Medical History: Past Medical History:  Diagnosis Date   Asthma    Medication List:  Current Outpatient Medications  Medication Sig Dispense Refill   albuterol (PROVENTIL HFA;VENTOLIN HFA) 108 (90 BASE) MCG/ACT inhaler Inhale 2 puffs into the lungs every 6 (six) hours as needed for wheezing.     diphenoxylate-atropine (LOMOTIL) 2.5-0.025 MG per tablet Take 1 tablet by mouth 4 (four) times daily as needed for diarrhea or loose stools. 16 tablet 0   ondansetron (ZOFRAN ODT) 8 MG disintegrating tablet Take 1 tablet (8 mg total) by mouth every 8 (eight) hours as needed for nausea. 20 tablet 0   ondansetron (ZOFRAN) 4 MG tablet Take 1 tablet (4 mg total) by mouth every 6 (six) hours. 12 tablet 0   No current facility-administered medications for this visit.   Known Allergies:  Allergies  Allergen Reactions   Peanut-Containing Drug Products     "RAW PEANUTS"   Soy Allergy    Past Surgical History: Past Surgical History:  Procedure Laterality Date   ORTHOPEDIC SURGERY     WISDOM TOOTH EXTRACTION     Family History: No family history on  file. Social History: Eric Hoover lives in a house built 10 years ago, no water damage, carpet floors, electric heating, central AC, no pets, no cockroaches, no smoke exposure, he is currently in the Agricultural engineer duty recently.  [place]  No HEPA filter in the home.  [place] not near interstate/industrial area.   ROS:  All other systems negative except as noted per HPI.  Objective:  Blood pressure 126/72, pulse 75, temperature 97.9 F (36.6 C), temperature source Temporal, resp. rate 18, height 6' 0.75" (1.848 m), weight 205 lb 3.2 oz (93.1 kg), SpO2 98 %. Body mass index is 27.26 kg/m. Physical Exam:  General Appearance:  Alert, cooperative, no distress, appears stated age  Head:  Normocephalic, without obvious abnormality, atraumatic  Eyes:  Conjunctiva clear, EOM's intact  Nose: Nares normal, hypertrophic turbinates, normal mucosa, no visible anterior polyps, and septum midline  Throat: Lips, tongue normal; teeth and gums normal, normal posterior oropharynx  Neck: Supple, symmetrical  Lungs:   clear to auscultation bilaterally, Respirations unlabored, no coughing  Heart:  regular rate and rhythm and no murmur, Appears well perfused  Extremities: No edema  Skin: Skin color, texture, turgor normal, no rashes or lesions on visualized portions of skin  Neurologic: No gross deficits     Diagnostics: Spirometry:  Tracings reviewed. His effort: Good reproducible efforts. FVC: 4.17L FEV1: 3.35L, 85% predicted  FEV1/FVC ratio: 98% Interpretation: Spirometry consistent with normal pattern   Skin Testing: Environmental allergy panel and select foods.  Adequate controls. Results discussed with Eric Hoover/family.  Airborne Adult Perc - 11/19/21 1004     Time Antigen Placed 1004    Allergen Manufacturer Greer    Location Back    Number of Test 59    Panel 1 Select    1. Control-Buffer 50% Glycerol Negative    2.  Control-Histamine 1 mg/ml 3+    3. Albumin saline Negative    4. [place] Negative    5. [place] 2+    6. Johnson Negative    7. [place] Blue 3+    8. Meadow Fescue 2+    9. Perennial Rye 3+    10. Sweet Vernal 3+    11. Timothy 4+    12. Cocklebur Negative    13. Burweed Marshelder 2+    14. Ragweed, short Negative    15. Ragweed, Giant 2+    16. Plantain,  English 2+    17. Lamb's Quarters Negative    18. Sheep Sorrell Negative    19. Rough Pigweed Negative    20. Marsh Elder, Rough Negative    21. Mugwort, Common Negative    22. Ash mix 2+    23. Birch mix 3+    24. Beech American 3+    25. Box, Elder 3+    26. Cedar, red 3+    27. Cottonwood, Eastern 3+    28. Elm mix Negative    29. Hickory 3+    30. Maple mix Negative    31. Oak, [place] mix 3+    32. Pecan Pollen 3+    33. Pine mix 2+    34. Sycamore Eastern Negative    35. Walnut, Black Pollen Negative    36. Alternaria alternata Negative    37. Cladosporium Herbarum Negative    38. Aspergillus mix  Negative    39. Penicillium mix Negative    40. Bipolaris sorokiniana (Helminthosporium) Negative    41. Drechslera spicifera (Curvularia) 2+    42. Mucor plumbeus 2+    43. Fusarium moniliforme 2+    44. Aureobasidium pullulans (pullulara) 3+    45. Rhizopus oryzae 2+    46. Botrytis cinera Negative    47. Epicoccum nigrum Negative    48. Phoma betae Negative    49. Candida Albicans Negative    50. Trichophyton mentagrophytes Negative    51. Mite, D Farinae  5,000 AU/ml Negative    52. Mite, D Pteronyssinus  5,000 AU/ml 4+    53. Cat Hair 10,000 BAU/ml Negative    54.  Dog Epithelia Negative    55. Mixed Feathers 3+    56. Horse Epithelia 3+    57. Cockroach, German Negative    58. Mouse Negative    59. Tobacco Leaf Negative             Intradermal - 11/19/21 1102     Time Antigen Placed 1102    Allergen Manufacturer Greer    Location Back    Number of Test 7    Intradermal Select    Control  Negative    Johnson 4+    Mold 1 4+    Mold 2 4+    Cat 4+    Dog 3+    Cockroach 3+             Food Adult Perc - 11/19/21 1000     Time Antigen Placed 1005    Allergen Manufacturer Greer    Location Back    Number of allergen test 2    1. Peanut --   6*24   2. Soybean --   10*25            Allergy testing results were read and interpreted by myself, documented by clinical staff.  Assessment and Plan  Chronic Rhinitis -seasonal and perennial allergic rhinitis: Year-round nasal and eye symptoms since childhood.  Interested in allergy injections. - allergy testing today was positive to dust mites, grass pollen, tree pollen, ragweed pollen, weed pollen, minor molds, horse and mixed feathers.  Intradermal testing was positive to cat, dog, major indoor and outdoor molds as well as cockroach - previous labs positive to dust mites, grass pollen, cockroach, tree pollen, ragweed pollen, weed pollen - allergen avoidance as below - consider allergy shots as long term control of your symptoms by teaching your immune system to be more tolerant of your allergy triggers  - scheduled for RUSH, insurance called and verified during visit.  Consent signed. - Continue Flonase (fluticasone) 1- 2 sprays in each nostril daily  - Continue Singulair (Montelukast) 10mg  nightly. 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  Required 5 or 6 hospitalizations prior to getting started on a controller inhaler.  None since being on a controller inhaler.  No prior ICU stays.  No steroids in the past year. - your lung testing today looked great-normal spirometry without obstruction present, currently asymptomatic - Controller Inhaler: Continue Advair 115 mcg 2 puffs twice a day; This Should Be Used Everyday - Rinse mouth out after use - Rescue Inhaler: Albuterol  (Proair/Ventolin) 2 puffs . Use  every 4-6 hours as needed for chest tightness, wheezing, or coughing.  Can also use 15 minutes prior to exercise  if you have symptoms with activity. - Asthma is not controlled if:  - Symptoms are occurring >2 times a week OR  - >2 times a month nighttime awakenings  - You are requiring systemic steroids (prednisone/steroid injections) more than once per year  - Your require hospitalization for your asthma.  - Please call the clinic to schedule a follow up if these symptoms arise  Reflux:  - continue pantroprazole 40 mg twice a day (take 30 minute prior to meals)  -Dietary and lifestyle changes as below  Food allergy:  Tongue and mouth itching with soy, role peanuts and certain fruits and vegetables.  Tolerates peanut butter and tree nuts. - today's skin testing was positive to soy and peanuts - please strictly avoid soy and raw peanuts (suspect pollen food allergy syndrome with peanuts, okay to eat as peanut butter, but should not eat raw) - for SKIN only reaction, okay to take Benadryl 2 capsules every 4 hours - for SKIN + ANY additional symptoms, OR IF concern for LIFE THREATENING reaction = Epipen Autoinjector EpiPen 0.3 mg. - If using Epinephrine autoinjector, call 911 - A food allergy action plan has been provided and discussed. - Medic Alert identification is recommended.  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 - If you can eat these things and tolerate the symptoms, it is fine to continue to do so.  If not, you may avoid these fresh fruits and vegetables.   - Heating these foods, buying them canned, and peeling these foods should allow them to be consumed without symptoms or with less symptoms.   This note in its  entirety was forwarded to the Provider who requested this consultation.  Thank you for your kind referral. I appreciate the opportunity to take part in Jaisean's care. Please do not hesitate to contact me with questions.  Sincerely,  Tonny Bollman, MD [facility]

## 2021-11-19 ENCOUNTER — Ambulatory Visit (INDEPENDENT_AMBULATORY_CARE_PROVIDER_SITE_OTHER): Payer: No Typology Code available for payment source | Admitting: Internal Medicine

## 2021-11-19 ENCOUNTER — Encounter: Payer: Self-pay | Admitting: Internal Medicine

## 2021-11-19 VITALS — BP 126/72 | HR 75 | Temp 97.9°F | Resp 18 | Ht 72.75 in | Wt 205.2 lb

## 2021-11-19 DIAGNOSIS — J3089 Other allergic rhinitis: Secondary | ICD-10-CM

## 2021-11-19 DIAGNOSIS — T781XXA Other adverse food reactions, not elsewhere classified, initial encounter: Secondary | ICD-10-CM

## 2021-11-19 DIAGNOSIS — J4530 Mild persistent asthma, uncomplicated: Secondary | ICD-10-CM | POA: Diagnosis not present

## 2021-11-19 DIAGNOSIS — Z91018 Allergy to other foods: Secondary | ICD-10-CM

## 2021-11-19 DIAGNOSIS — J302 Other seasonal allergic rhinitis: Secondary | ICD-10-CM

## 2021-11-19 DIAGNOSIS — T7819XA Other adverse food reactions, not elsewhere classified, initial encounter: Secondary | ICD-10-CM

## 2021-11-19 DIAGNOSIS — T7800XA Anaphylactic reaction due to unspecified food, initial encounter: Secondary | ICD-10-CM

## 2021-11-19 MED ORDER — FLUTICASONE PROPIONATE 50 MCG/ACT NA SUSP: 1.0000 | Freq: Every day | NASAL | 5 refills | Status: DC

## 2021-11-19 MED ORDER — MONTELUKAST SODIUM 10 MG PO TABS: 10.0000 mg | ORAL_TABLET | Freq: Every day | ORAL | 5 refills | Status: DC

## 2021-11-19 MED ORDER — PANTOPRAZOLE SODIUM 40 MG PO TBEC
40.0000 mg | DELAYED_RELEASE_TABLET | Freq: Two times a day (BID) | ORAL | 5 refills | Status: DC
Start: 2021-11-19 — End: 2023-08-24

## 2021-11-19 MED ORDER — CETIRIZINE HCL 10 MG PO TABS: 10.0000 mg | ORAL_TABLET | Freq: Every day | ORAL | 5 refills | Status: DC

## 2021-11-19 MED ORDER — FLUTICASONE-SALMETEROL 115-21 MCG/ACT IN AERO: 2.0000 | INHALATION_SPRAY | Freq: Two times a day (BID) | RESPIRATORY_TRACT | 5 refills | Status: AC

## 2021-11-19 MED ORDER — OLOPATADINE HCL 0.1 % OP SOLN: 1.0000 [drp] | Freq: Two times a day (BID) | OPHTHALMIC | 12 refills | Status: DC | PRN

## 2021-11-19 MED ORDER — EPINEPHRINE 0.3 MG/0.3ML IJ SOAJ: 0.3000 mg | INTRAMUSCULAR | 2 refills | Status: DC | PRN

## 2021-11-19 NOTE — Patient Instructions (Addendum)
Chronic Rhinitis -seasonal and perennial allergic rhinitis: - allergy testing today was positive to dust mites, grass pollen, tree pollen, ragweed pollen, weed pollen, minor molds, horse and mixed feathers.  Intradermal testing was positive to cat, dog, major indoor and outdoor molds as well as cockroach - previous labs positive to dust mites, grass pollen, cockroach, tree pollen, ragweed pollen, weed pollen - allergen avoidance as below - consider allergy shots as long term control of your symptoms by teaching your immune system to be more tolerant of your allergy triggers - Continue Flonase (fluticasone) 1- 2 sprays in each nostril daily  - Continue Singulair (Montelukast) 10mg  nightly. - Continue Zyrtec (Cetirizine) 10mg  daily  Allergic Conjunctivitis:  - Continue Patanol eyedrops daily as needed  Mild Persistent Asthma: - your lung testing today looked great - Controller Inhaler: Continue Advair 115 mcg 2 puffs twice a day; This Should Be Used Everyday - Rinse mouth out after use - Rescue Inhaler: Albuterol (Proair/Ventolin) 2 puffs . Use  every 4-6 hours as needed for chest tightness, wheezing, or coughing.  Can also use 15 minutes prior to exercise if you have symptoms with activity. - Asthma is not controlled if:  - Symptoms are occurring >2 times a week OR  - >2 times a month nighttime awakenings  - You are requiring systemic steroids (prednisone/steroid injections) more than once per year  - Your require hospitalization for your asthma.  - Please call the clinic to schedule a follow up if these symptoms arise  Reflux:  - continue pantroprazole 40 mg twice a day (take 30 minute prior to meals)  -Dietary and lifestyle changes as below  Food allergy:  - today's skin testing was positive to soy and peanuts - please strictly avoid soy and raw peanuts (suspect pollen food allergy syndrome with peanuts, okay to eat as peanut butter, but should not eat raw) - for SKIN only reaction,  okay to take Benadryl 2 capsules every 4 hours - for SKIN + ANY additional symptoms, OR IF concern for LIFE THREATENING reaction = Epipen Autoinjector EpiPen 0.3 mg. - If using Epinephrine autoinjector, call 911 - A food allergy action plan has been provided and discussed. - Medic Alert identification is recommended.  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 - If you can eat these things and tolerate the symptoms, it is fine to continue to do so.  If not, you may avoid these fresh fruits and vegetables.   - Heating these foods, buying them canned, and peeling these foods should allow them to be consumed without symptoms or with less symptoms.   Follow-up for allergy injections-call insurance to ensure coverage and then call back to make your appointment Follow-up in 3 months with provider, sooner  if needed It was a pleasure meeting you in clinic today , MD [facility]   Reducing Pollen Exposure  The American Academy of Allergy, Asthma and Immunology suggests the following steps to reduce your exposure to pollen during allergy seasons.    Do not hang sheets or clothing out to dry; pollen may collect on these items. Do not mow lawns or spend time around freshly cut grass; mowing stirs up pollen. Keep windows closed at night.  Keep car windows closed while driving.  Minimize morning activities outdoors, a time when pollen counts are usually at their highest. Stay indoors as much as possible when pollen counts or humidity is high and on windy days when pollen tends to remain in the air longer. Use air conditioning when possible.  Many air conditioners have filters that trap the pollen spores. Use a HEPA room air filter to remove  pollen form the indoor air you breathe. DUST MITE AVOIDANCE MEASURES:  There are three main measures that need and can be taken to avoid house dust mites:  Reduce accumulation of dust in general -reduce furniture, clothing, carpeting, books, stuffed animals, especially in bedroom  Separate yourself from the dust -use pillow and mattress encasements (can be found at stores such as [employer] or online) -avoid direct exposure to air condition flow -use a HEPA filter device, especially in the bedroom; you can also use a HEPA filter vacuum cleaner -wipe dust with a moist towel instead of a dry towel or broom when cleaning  Decrease mites and/or their secretions -wash clothing and linen and stuffed animals at highest temperature possible, at least every 2 weeks -stuffed animals can also be placed in a bag and put in a freezer overnight  Despite the above measures, it is impossible to eliminate dust mites or their allergen completely from your home.  With the above measures the burden of mites in your home can be diminished, with the goal of minimizing your allergic symptoms.  Success will be reached only when implementing and using all means together.  Control of Mold Allergen   Mold and fungi can grow on a variety of surfaces provided certain temperature and moisture conditions exist.  Outdoor molds grow on plants, decaying vegetation and soil.  The major outdoor mold, Alternaria and Cladosporium, are found in very high numbers during hot and dry conditions.  Generally, a late Summer - Fall peak is seen for common outdoor fungal spores.  Rain will temporarily lower outdoor mold spore count, but counts rise rapidly when the rainy period ends.  The most important indoor molds are Aspergillus and Penicillium.  Dark, humid and poorly ventilated basements are ideal sites for mold growth.  The next most common sites of mold growth are the bathroom and the kitchen.  Outdoor (Seasonal) Mold  Control  Use air conditioning and keep windows closed Avoid exposure to decaying vegetation. Avoid leaf raking. Avoid grain handling. Consider wearing a face mask if working in moldy areas.    Indoor (Perennial) Mold Control   Maintain humidity below 50%. Clean washable surfaces with 5% bleach solution. Remove sources e.g. contaminated carpets.  Control of Dog or Cat Allergen  Avoidance is the best way to manage a dog or cat allergy. If you have a dog or cat and are allergic to dog or cats, consider removing the dog or cat from the home. If you have a dog or cat but don't want to find it a new home, or if your family wants a pet even though someone in the household is allergic, here are some strategies that may help keep symptoms at bay:  Keep the pet out of your bedroom and restrict it to only a few rooms. Be advised that keeping the dog or cat in only one room will not limit the allergens to that room. Don't pet, hug or kiss the dog or cat; if you do, wash your hands with soap and water. High-efficiency particulate air (HEPA) cleaners run continuously in a bedroom  or living room can reduce allergen levels over time. Regular use of a high-efficiency vacuum cleaner or a central vacuum can reduce allergen levels. Giving your dog or cat a bath at least once a week can reduce airborne allergen.  Control of Cockroach Allergen  Cockroach allergen has been identified as an important cause of acute attacks of asthma, especially in urban settings.  There are fifty-five species of cockroach that exist in the [place], however only three, the [place], [place] species produce allergen that can affect patients with Asthma.  Allergens can be obtained from fecal particles, egg casings and secretions from cockroaches.    Remove food sources. Reduce access to water. Seal access and entry points. Spray runways with 0.5-1% Diazinon or Chlorpyrifos Blow boric acid power under stoves  and refrigerator. Place bait stations (hydramethylnon) at feeding sites.

## 2021-11-20 ENCOUNTER — Ambulatory Visit: Payer: Self-pay | Admitting: Allergy

## 2021-11-20 ENCOUNTER — Other Ambulatory Visit: Payer: Self-pay | Admitting: Internal Medicine

## 2021-11-20 DIAGNOSIS — J302 Other seasonal allergic rhinitis: Secondary | ICD-10-CM

## 2021-11-20 NOTE — Progress Notes (Signed)
Allergen Rx for [facility] written

## 2021-11-23 NOTE — Progress Notes (Signed)
MAKE CLOSER TO APPT DATE.

## 2021-11-23 NOTE — Progress Notes (Signed)
Aeroallergen Immunotherapy   Ordering Provider: Dr. Tonny Bollman   Patient Details  Name: Eric Hoover  MRN: [id number]  Date of Birth: 08/06/1985   Order 2 of 2   Vial Label: Mo-DM-Cat-CR   0.2 ml (Volume)  1:20 Concentration -- Alternaria alternata  0.2 ml (Volume)  1:20 Concentration -- Cladosporium herbarum  0.2 ml (Volume)  1:10 Concentration -- Aspergillus mix  0.2 ml (Volume)  1:10 Concentration -- Penicillium mix  0.2 ml (Volume)  1:20 Concentration -- Drechslera spicifera  0.2 ml (Volume)  1:10 Concentration -- Mucor plumbeus  0.2 ml (Volume)  1:10 Concentration -- Fusarium moniliforme  0.2 ml (Volume)  1:40 Concentration -- Aureobasidium pullulans  0.2 ml (Volume)  1:10 Concentration -- Rhizopus oryzae  0.5 ml (Volume)  1:10 Concentration -- Cat Hair  0.3 ml (Volume)  1:20 Concentration -- Cockroach, German  0.5 ml (Volume)   AU Concentration -- Mite Mix (DF 5,000 & DP 5,000)    3.1  ml Extract Subtotal  2.9  ml Diluent  5.0  ml Maintenance Total   Schedule:  RUSH then B  Silver Vial (1:1,000,000): RUSH  Blue Vial (1:100,000): RUSH  Yellow Vial (1:10,000): RUSH  Green Vial (1:1,000): Schedule B (6 doses)  Red Vial (1:100): Schedule A (14 doses)   Special Instructions: RUSH then B

## 2021-11-23 NOTE — Progress Notes (Signed)
Aeroallergen Immunotherapy   Ordering Provider: Dr. Tonny Bollman   Patient Details  Name: Carthel Castille  MRN: [id number]  Date of Birth: 1985/10/07   Order 1 of 2   Vial Label: G-W-T-Dog   0.3 ml (Volume)  BAU Concentration -- 7 Grass Mix* 100,000 ([address] [place], [place], [place], [place] Rye, RedTop, Sweet Vernal, Timothy)  0.3 ml (Volume)  BAU Concentration -- [place] 10,000  0.2 ml (Volume)  1:20 Concentration -- Johnson  0.3 ml (Volume)  1:20 Concentration -- Ragweed Mix  0.2 ml (Volume)  1:20 Concentration -- Burweed Marshelder  0.2 ml (Volume)  1:10 Concentration -- Plantain English  0.2 ml (Volume)  1:10 Concentration -- Sheep Sorrell*  0.2 ml (Volume)  1:20 Concentration -- Rough Pigweed*  0.5 ml (Volume)  1:20 Concentration -- Eastern 10 Tree Mix (also Sweet Gum)  0.2 ml (Volume)  1:20 Concentration -- Box Elder  0.2 ml (Volume)  1:10 Concentration -- Cedar, red  0.2 ml (Volume)  1:10 Concentration -- Pecan Pollen  0.2 ml (Volume)  1:10 Concentration -- Pine Mix  0.5 ml (Volume)  1:10 Concentration -- Dog Epithelia    3.7  ml Extract Subtotal  1.3  ml Diluent  5.0  ml Maintenance Total  Schedule:  RUSH then B  Silver Vial (1:1,000,000): RUSH  Blue Vial (1:100,000): RUSH  Yellow Vial (1:10,000): RUSH  Green Vial (1:1,000): Schedule B (6 doses)  Red Vial (1:100): Schedule A (14 doses)   Special Instructions: RUSH then B

## 2021-11-23 NOTE — Progress Notes (Deleted)
Aeroallergen Immunotherapy   Ordering Provider: Dr. Erin Dennis   Patient Details  Name: Eric Hoover  MRN: [id number]  Date of Birth: 02/06/1986   Order 1 of 2   Vial Label: G-W-T-Dog   0.3 ml (Volume)  BAU Concentration -- 7 Grass Mix* 100,000 ([place] Blue, Meadow Fescue, Orchard, Perennial Rye, RedTop, Sweet Vernal, Timothy)  0.3 ml (Volume)  BAU Concentration -- [place] 10,000  0.2 ml (Volume)  1:20 Concentration -- Johnson  0.3 ml (Volume)  1:20 Concentration -- Ragweed Mix  0.2 ml (Volume)  1:20 Concentration -- Burweed Marshelder  0.2 ml (Volume)  1:10 Concentration -- Plantain English  0.2 ml (Volume)  1:10 Concentration -- Sheep Sorrell*  0.2 ml (Volume)  1:20 Concentration -- Rough Pigweed*  0.5 ml (Volume)  1:20 Concentration -- Eastern 10 Tree Mix (also Sweet Gum)  0.2 ml (Volume)  1:20 Concentration -- Box Elder  0.2 ml (Volume)  1:10 Concentration -- Cedar, red  0.2 ml (Volume)  1:10 Concentration -- Pecan Pollen  0.2 ml (Volume)  1:10 Concentration -- Pine Mix  0.5 ml (Volume)  1:10 Concentration -- Dog Epithelia    3.7  ml Extract Subtotal  1.3  ml Diluent  5.0  ml Maintenance Total  Schedule:  RUSH then B  Silver Vial (1:1,000,000): RUSH  Blue Vial (1:100,000): RUSH  Yellow Vial (1:10,000): RUSH  Green Vial (1:1,000): Schedule B (6 doses)  Red Vial (1:100): Schedule A (14 doses)   Special Instructions: RUSH then B 

## 2021-11-30 NOTE — Progress Notes (Signed)
VIALS EXP 12-01-22 

## 2021-12-04 DIAGNOSIS — J3081 Allergic rhinitis due to animal (cat) (dog) hair and dander: Secondary | ICD-10-CM

## 2021-12-07 DIAGNOSIS — J3089 Other allergic rhinitis: Secondary | ICD-10-CM | POA: Diagnosis not present

## 2021-12-23 ENCOUNTER — Other Ambulatory Visit: Payer: Self-pay

## 2021-12-23 MED ORDER — PREDNISONE 20 MG PO TABS: ORAL_TABLET | ORAL | 0 refills | Status: DC

## 2021-12-23 MED ORDER — FAMOTIDINE 20 MG PO TABS: ORAL_TABLET | ORAL | 0 refills | Status: AC

## 2021-12-25 ENCOUNTER — Ambulatory Visit: Payer: No Typology Code available for payment source | Admitting: Internal Medicine

## 2021-12-29 ENCOUNTER — Ambulatory Visit: Payer: No Typology Code available for payment source | Admitting: Internal Medicine

## 2022-10-27 ENCOUNTER — Telehealth: Payer: Self-pay | Admitting: Internal Medicine

## 2022-10-27 NOTE — Telephone Encounter (Signed)
Patient's current authorization, [id number] expires on 11-19-2022.   Left message for patient to call back to discuss - patient has not been seen since initial visit in Sept 2023, would like to know if patient would like a renewal of services request sent on his behalf to the [place] to continue care at our practice.

## 2023-08-22 ENCOUNTER — Ambulatory Visit: Admitting: Internal Medicine

## 2023-08-23 ENCOUNTER — Ambulatory Visit: Admitting: Internal Medicine

## 2023-08-24 ENCOUNTER — Encounter: Payer: Self-pay | Admitting: Internal Medicine

## 2023-08-24 ENCOUNTER — Ambulatory Visit: Admitting: Internal Medicine

## 2023-08-24 VITALS — BP 118/72 | HR 81 | Temp 97.3°F | Resp 16 | Wt 221.2 lb

## 2023-08-24 DIAGNOSIS — J4530 Mild persistent asthma, uncomplicated: Secondary | ICD-10-CM

## 2023-08-24 DIAGNOSIS — J3089 Other allergic rhinitis: Secondary | ICD-10-CM | POA: Diagnosis not present

## 2023-08-24 DIAGNOSIS — T781XXD Other adverse food reactions, not elsewhere classified, subsequent encounter: Secondary | ICD-10-CM

## 2023-08-24 DIAGNOSIS — R066 Hiccough: Secondary | ICD-10-CM | POA: Diagnosis not present

## 2023-08-24 DIAGNOSIS — H1045 Other chronic allergic conjunctivitis: Secondary | ICD-10-CM

## 2023-08-24 DIAGNOSIS — T7800XA Anaphylactic reaction due to unspecified food, initial encounter: Secondary | ICD-10-CM

## 2023-08-24 DIAGNOSIS — J302 Other seasonal allergic rhinitis: Secondary | ICD-10-CM

## 2023-08-24 MED ORDER — EPINEPHRINE 0.3 MG/0.3ML IJ SOAJ: 0.3000 mg | INTRAMUSCULAR | 2 refills | Status: DC | PRN

## 2023-08-24 MED ORDER — CETIRIZINE HCL 10 MG PO TABS: 10.0000 mg | ORAL_TABLET | Freq: Every day | ORAL | 5 refills | Status: DC

## 2023-08-24 MED ORDER — ALBUTEROL SULFATE HFA 108 (90 BASE) MCG/ACT IN AERS: 2.0000 | INHALATION_SPRAY | Freq: Four times a day (QID) | RESPIRATORY_TRACT | 1 refills | Status: DC | PRN

## 2023-08-24 MED ORDER — FLUTICASONE PROPIONATE 50 MCG/ACT NA SUSP: 1.0000 | Freq: Every day | NASAL | 5 refills | Status: DC

## 2023-08-24 MED ORDER — MONTELUKAST SODIUM 10 MG PO TABS: 10.0000 mg | ORAL_TABLET | Freq: Every day | ORAL | 5 refills | Status: DC

## 2023-08-24 MED ORDER — CROMOLYN SODIUM 4 % OP SOLN: 1.0000 [drp] | Freq: Four times a day (QID) | OPHTHALMIC | 12 refills | Status: DC

## 2023-08-24 NOTE — Progress Notes (Signed)
 FOLLOW UP Date of Service/Encounter:  08/24/23  Subjective:  Eric Hoover (DOB: 22-Jul-1985) is a 38 y.o. male who [facility] in re-evaluation of the following: sthma, allergies, oral allergy  syndrome, rhinitis History obtained from: chart review and patient.  For Review, LV was on 11/19/21  with Dr.Dennis seen for intial visit for asthma, allergies, oral allergy  syndrome, rhinitis.  FEV1: 3.35L, 85% predicted.  Plan was to start Rush immunotherapy the patient had his surgery and could not schedule RUSH.  See below for summary of history and diagnostics.   Today presents for follow-up. Discussed the use of AI scribe software for clinical note transcription with the patient, who gave verbal consent to proceed.  History of Present Illness Eric Hoover is a 38 year old male with allergies and asthma who presents for allergy  management and evaluation of persistent symptoms.   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  He experiences persistent allergy  symptoms, including nasal congestion, itchy eyes, and frequent sneezing. He has been using Flonase , Singulair , and Zyrtec , but has not taken them recently due to travel. A recent sneezing episode led to a sensation of something 'popping' in his throat, which has been sore since.  He has a history of asthma and was previously on Advair, but has not used it since spring. No current cough, wheeze, or shortness of breath. He has used his rescue inhaler only a couple of times recently, primarily while in the [place]  He experiences frequent hiccups, sometimes lasting for hours, but not exceeding 48 hours. He has not been taking pantoprazole  and does not report typical heartburn symptoms, though he occasionally experiences prolonged hiccups.  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 He continues to  experience mouth itching with certain fruits and vegetables, which varies with pollen exposure.    Tolerates peanut butter  He recently returned from a three-week trip to the [place] and has been experiencing jet lag and fatigue since his return.    he may have a deployment to [place] in the spring 2026 although this is voluntary.  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  He would like to start Rush in September if possible and does feel like he could commit to it at this time.    All medications reviewed by clinical staff and updated in chart. No new pertinent medical or surgical history except as noted in HPI.  ROS: All others negative except as noted per HPI.   Objective:  BP 118/72   Pulse 81   Temp (!) 97.3 F (36.3 C) (Temporal)   Resp 16   Wt 221 lb 3.2 oz (100.3 kg)   SpO2 98%   BMI 29.38 kg/m  Body mass index is 29.38 kg/m. Physical Exam: General Appearance:  Alert, cooperative, tired appearing, appears stated age  Head:  Normocephalic, without obvious abnormality, atraumatic  Eyes:  Conjunctiva clear, EOM's intact  Ears EACs normal bilaterally  Nose: Nares normal, pale edematous nasal mucosa, hypertrophic turbinates, no visible anterior polyps, and septum midline  Throat: Lips, tongue normal; teeth and gums normal, + cobblestoning  Neck: Supple, symmetrical  Lungs:   clear to auscultation bilaterally, Respirations unlabored, no coughing  Heart:  regular rate and rhythm and no murmur, Appears well perfused  Extremities: No edema  Skin: Skin color, texture, turgor normal and no rashes or lesions on visualized portions  of skin  Neurologic: No gross deficits   Labs:  Lab Orders  No laboratory test(s) ordered today    Spirometry:  Tracings reviewed. His effort: Good reproducible efforts. FVC: 3.49L FEV1: 2.77L, 71% predicted FEV1/FVC ratio: 79% Interpretation: Spirometry consistent with normal pattern.  Please see scanned spirometry results for  details.  Assessment/Plan   Patient Instructions  Chronic Rhinitis -seasonal and perennial allergic rhinitis: - allergy  testing today was positive to dust mites, grass pollen, tree pollen, ragweed pollen, weed pollen, minor molds, horse and mixed feathers.  Intradermal testing was positive to cat, dog, major indoor and outdoor molds as well as cockroach - previous labs positive to dust mites, grass pollen, cockroach, tree pollen, ragweed pollen, weed pollen - allergen avoidance as below - consider allergy  shots as long term control of your symptoms by teaching your immune system to be more tolerant of your allergy  triggers - Continue Flonase  (fluticasone ) 1- 2 sprays in each nostril daily  - Continue Singulair  (Montelukast ) 10mg  nightly. - Continue Zyrtec  (Cetirizine ) 10mg  daily  Allergic Conjunctivitis:  - Continue Cromolyn eye drops up to 4 times a day   Mild Persistent Asthma: controlled  - your lung testing today looked okay  - Controller Inhaler: None      ;   - Rinse mouth out after use - Rescue Inhaler: Albuterol (Proair/Ventolin) 2 puffs . Use  every 4-6 hours as needed for chest tightness, wheezing, or coughing.  Can also use 15 minutes prior to exercise if you have symptoms with activity. - Asthma is not controlled if:  - Symptoms are occurring >2 times a week OR  - >2 times a month nighttime awakenings  - You are requiring systemic steroids (prednisone /steroid injections) more than once per year  - Your require hospitalization for your asthma.  - Please call the clinic to schedule a follow up if these symptoms arise  Reflux:  -Dietary and lifestyle changes as below  Food allergy :  - today's skin testing was positive to soy and peanuts - please strictly avoid soy and raw peanuts (suspect pollen food allergy  syndrome with peanuts, okay to eat as peanut butter, but should not eat raw) - for SKIN only reaction, okay to take Benadryl  2 capsules every 4 hours - for SKIN + ANY  additional symptoms, OR IF concern for LIFE THREATENING reaction = Epipen  Autoinjector EpiPen  0.3 mg. - If using Epinephrine  autoinjector, call 911 - A food allergy  action plan has been provided and discussed. - Medic Alert identification is recommended.  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 - If you can eat these things and tolerate the symptoms, it is fine to continue to do so.  If not, you may avoid these fresh fruits and vegetables.   - Heating these foods, buying them canned, and peeling these foods should allow them to be consumed without symptoms or with less symptoms.   Hiccups  Follow up: for RUSH in September. If [place] deployment happens, we will hold and wait until after you retire in December   Thank you so much for letting me partake in your care today.  Don't hesitate to reach out if you have any additional concerns!  Orelia Binet, MD  [facility] [place], [place]   Other: reviewed spirometry  technique and reviewed inhaler technique  Thank you so much for letting me partake in your care today.  Don't hesitate to reach out if you have any additional concerns!  Orelia Binet, MD  [facility]- [place], [place]

## 2023-08-24 NOTE — Patient Instructions (Addendum)
 Chronic Rhinitis -seasonal and perennial allergic rhinitis: - allergy  testing today was positive to dust mites, grass pollen, tree pollen, ragweed pollen, weed pollen, minor molds, horse and mixed feathers.  Intradermal testing was positive to cat, dog, major indoor and outdoor molds as well as cockroach - previous labs positive to dust mites, grass pollen, cockroach, tree pollen, ragweed pollen, weed pollen - allergen avoidance as below - consider allergy  shots as long term control of your symptoms by teaching your immune system to be more tolerant of your allergy  triggers - Continue Flonase  (fluticasone ) 1- 2 sprays in each nostril daily  - Continue Singulair  (Montelukast ) 10mg  nightly. - Continue Zyrtec  (Cetirizine ) 10mg  daily  Allergic Conjunctivitis:  - Continue Cromolyn eye drops up to 4 times a day   Mild Persistent Asthma: controlled  - your lung testing today looked okay  - Controller Inhaler: None      ;   - Rinse mouth out after use - Rescue Inhaler: Albuterol (Proair/Ventolin) 2 puffs . Use  every 4-6 hours as needed for chest tightness, wheezing, or coughing.  Can also use 15 minutes prior to exercise if you have symptoms with activity. - Asthma is not controlled if:  - Symptoms are occurring >2 times a week OR  - >2 times a month nighttime awakenings  - You are requiring systemic steroids (prednisone /steroid injections) more than once per year  - Your require hospitalization for your asthma.  - Please call the clinic to schedule a follow up if these symptoms arise  Reflux:  -Dietary and lifestyle changes as below  Food allergy :  - today's skin testing was positive to soy and peanuts - please strictly avoid soy and raw peanuts (suspect pollen food allergy  syndrome with peanuts, okay to eat as peanut butter, but should not eat raw) - for SKIN only reaction, okay to take Benadryl  2 capsules every 4 hours - for SKIN + ANY additional symptoms, OR IF concern for LIFE  THREATENING reaction = Epipen  Autoinjector EpiPen  0.3 mg. - If using Epinephrine  autoinjector, call 911 - A food allergy  action plan has been provided and discussed. - Medic Alert identification is recommended.  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 - If you can eat these things and tolerate the symptoms, it is fine to continue to do so.  If not, you may avoid these fresh fruits and vegetables.   - Heating these foods, buying them canned, and peeling these foods should allow them to be consumed without symptoms or with less symptoms.   Hiccups  Follow up: for RUSH in September. If [place] deployment happens, we will hold and wait until after you retire in December   Thank you so much for letting me partake in your care today.  Don't hesitate to reach out if you have any additional concerns!  Orelia Binet, MD  [facility]- [place], [place]

## 2023-08-29 ENCOUNTER — Telehealth: Payer: Self-pay | Admitting: Internal Medicine

## 2023-08-29 MED ORDER — EPINEPHRINE 0.3 MG/0.3ML IJ SOAJ
0.3000 mg | INTRAMUSCULAR | 2 refills | Status: AC | PRN
Start: 2023-08-29 — End: ?

## 2023-08-29 MED ORDER — CETIRIZINE HCL 10 MG PO TABS: 10.0000 mg | ORAL_TABLET | Freq: Every day | ORAL | 5 refills | Status: AC

## 2023-08-29 MED ORDER — FLUTICASONE PROPIONATE 50 MCG/ACT NA SUSP: 1.0000 | Freq: Every day | NASAL | 5 refills | Status: AC

## 2023-08-29 MED ORDER — CROMOLYN SODIUM 4 % OP SOLN: 1.0000 [drp] | Freq: Four times a day (QID) | OPHTHALMIC | 12 refills | Status: AC

## 2023-08-29 MED ORDER — MONTELUKAST SODIUM 10 MG PO TABS: 10.0000 mg | ORAL_TABLET | Freq: Every day | ORAL | 5 refills | Status: AC

## 2023-08-29 MED ORDER — ALBUTEROL SULFATE HFA 108 (90 BASE) MCG/ACT IN AERS
2.0000 | INHALATION_SPRAY | Freq: Four times a day (QID) | RESPIRATORY_TRACT | 1 refills | Status: AC | PRN
Start: 2023-08-29 — End: ?

## 2023-08-29 NOTE — Telephone Encounter (Signed)
 All meds routed to the [place].

## 2023-08-29 NOTE — Telephone Encounter (Signed)
 PT request all meds that were sent in on 6/11 to [facility] need to be sent to [facility].

## 2023-08-29 NOTE — Addendum Note (Signed)
 Addended by: WILLIAMS, Cameran Pettey H on: 08/29/2023 02:16 PM   Modules accepted: Orders
